# Patient Record
Sex: Male | Born: 1965 | Hispanic: No | Marital: Married | Smoking: Former smoker
Health system: Southern US, Community
[De-identification: ages and names within clinical notes are randomized; demographics above are authoritative.]

## PROBLEM LIST (undated history)

## (undated) DIAGNOSIS — J45909 Unspecified asthma, uncomplicated: Secondary | ICD-10-CM

## (undated) DIAGNOSIS — Z87442 Personal history of urinary calculi: Secondary | ICD-10-CM

## (undated) DIAGNOSIS — T7840XA Allergy, unspecified, initial encounter: Secondary | ICD-10-CM

## (undated) HISTORY — DX: Allergy, unspecified, initial encounter: T78.40XA

## (undated) HISTORY — DX: Unspecified asthma, uncomplicated: J45.909

## (undated) HISTORY — DX: Personal history of urinary calculi: Z87.442

## (undated) HISTORY — PX: LITHOTRIPSY: SUR834

---

## 2005-10-22 ENCOUNTER — Encounter: Admission: RE | Admit: 2005-10-22 | Discharge: 2005-10-22 | Payer: Self-pay | Admitting: Occupational Medicine

## 2005-10-30 ENCOUNTER — Ambulatory Visit (HOSPITAL_COMMUNITY): Admission: RE | Admit: 2005-10-30 | Discharge: 2005-10-30 | Payer: Self-pay | Admitting: Urology

## 2007-03-16 IMAGING — CR DG ABDOMEN 1V
1 series · 1 of 1 positions shown · non-contrast
Comparison: CT dated 10/22/05.

CLINICAL DATA: Nephrolithiasis.  
 ABDOMEN - LXUC2:

[view not recorded]
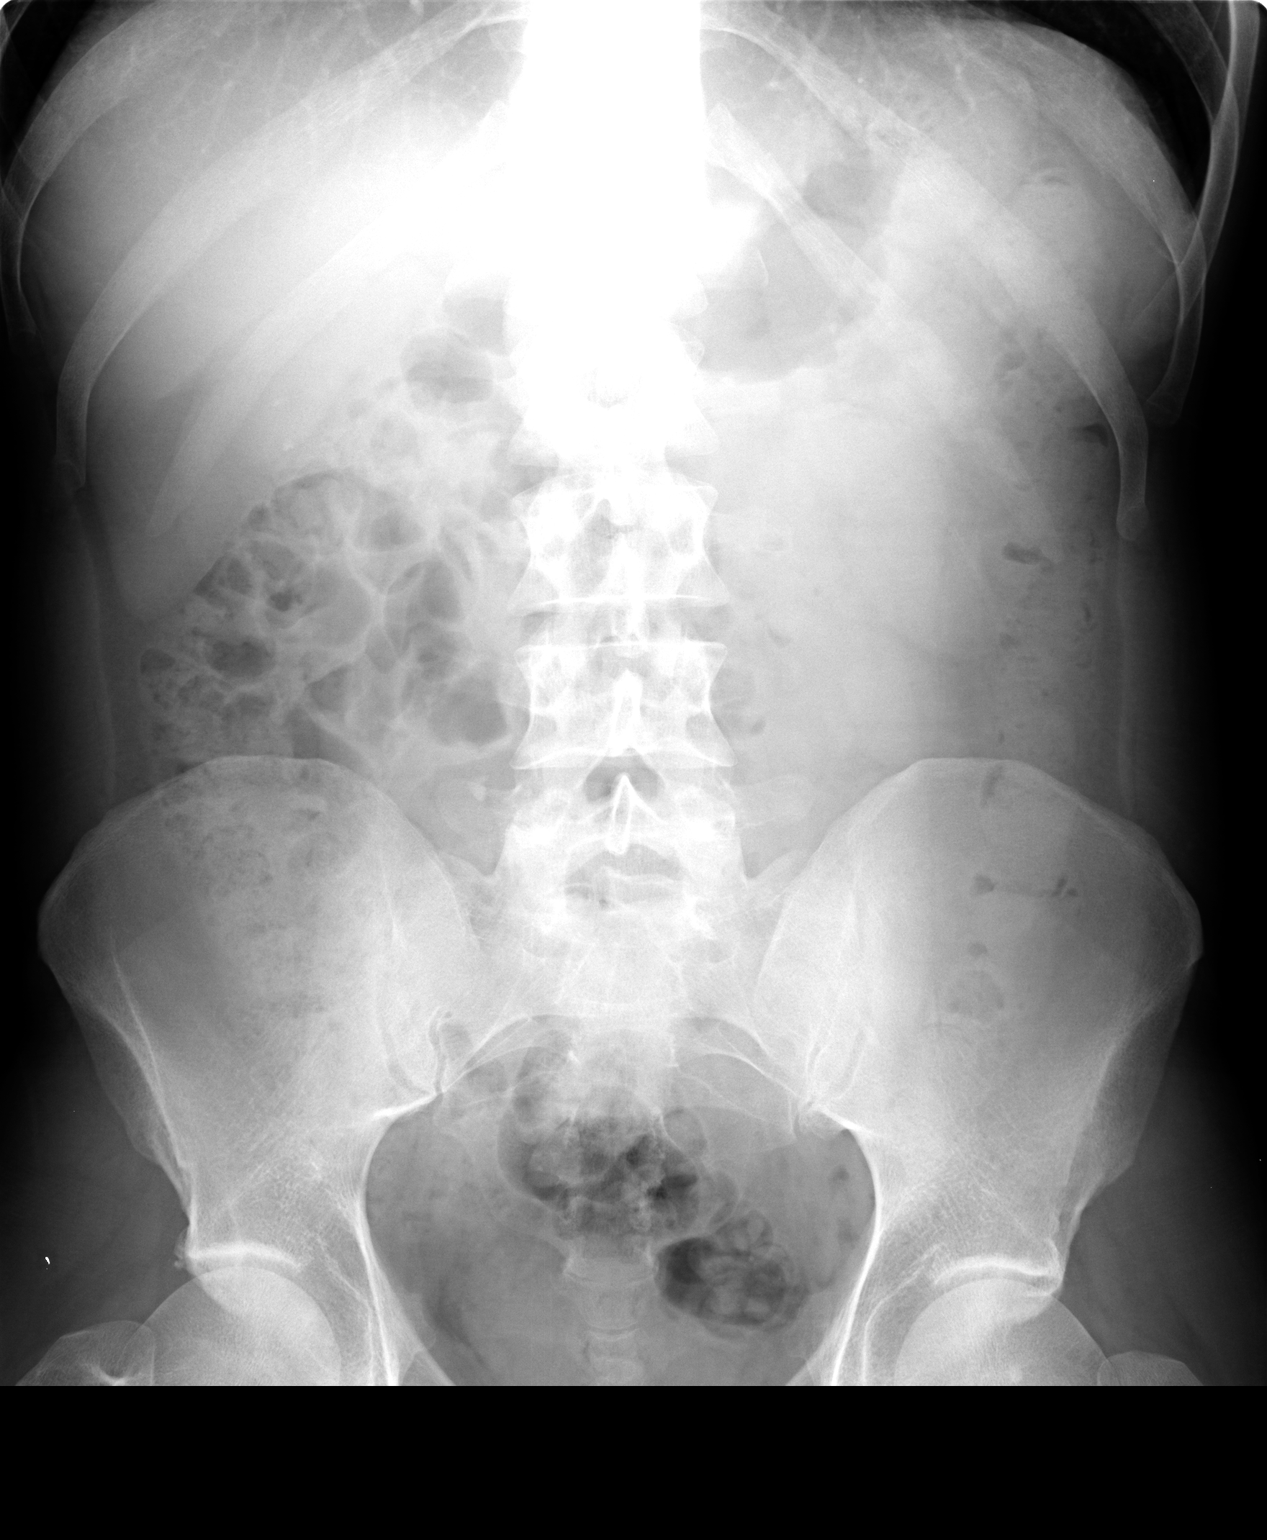

[1 of 1 positions shown; findings below may reference images not displayed]

FINDINGS: Several punctate calcific densities are seen within the right renal collecting system.     Within the mid right ureter, the previously described stone has migrated distally by approximately 2 cm. The ureteral stone is now at the level of the right L5 transverse process whereas it was at the level of the L4 transverse process.
IMPRESSION:

## 2007-07-12 ENCOUNTER — Emergency Department (HOSPITAL_COMMUNITY): Admission: EM | Admit: 2007-07-12 | Discharge: 2007-07-13 | Payer: Self-pay | Admitting: Emergency Medicine

## 2009-09-01 ENCOUNTER — Emergency Department (HOSPITAL_COMMUNITY): Admission: EM | Admit: 2009-09-01 | Discharge: 2009-09-01 | Payer: Self-pay | Admitting: Emergency Medicine

## 2011-01-16 IMAGING — CR DG FINGER THUMB 2+V*L*
3 series · 3 of 3 positions shown · non-contrast
Comparison: None

CLINICAL DATA: Status post fall, with left thumb pain.

LEFT THUMB 2+V

[x finger pa left]
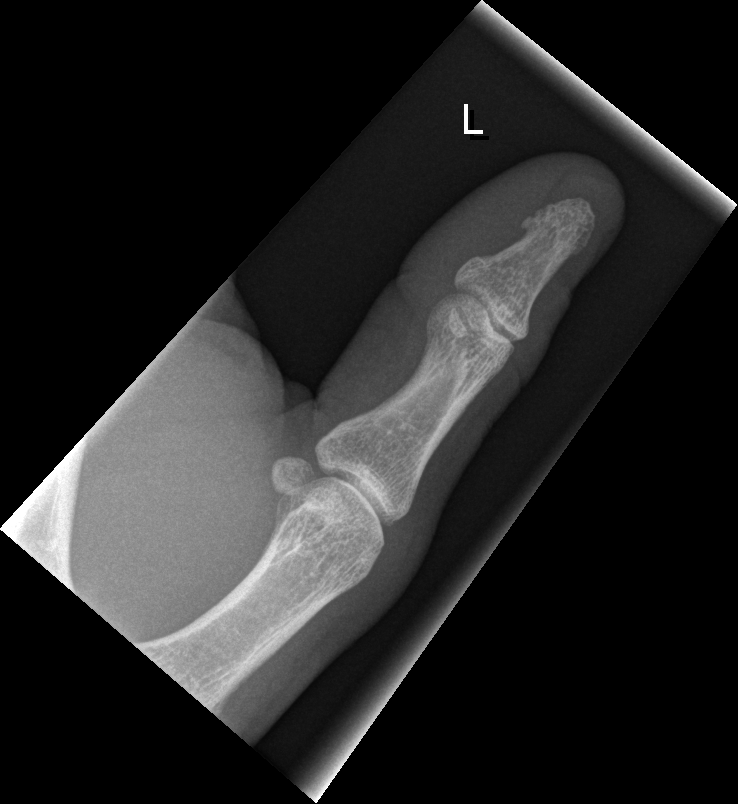

[x finger obl. left]
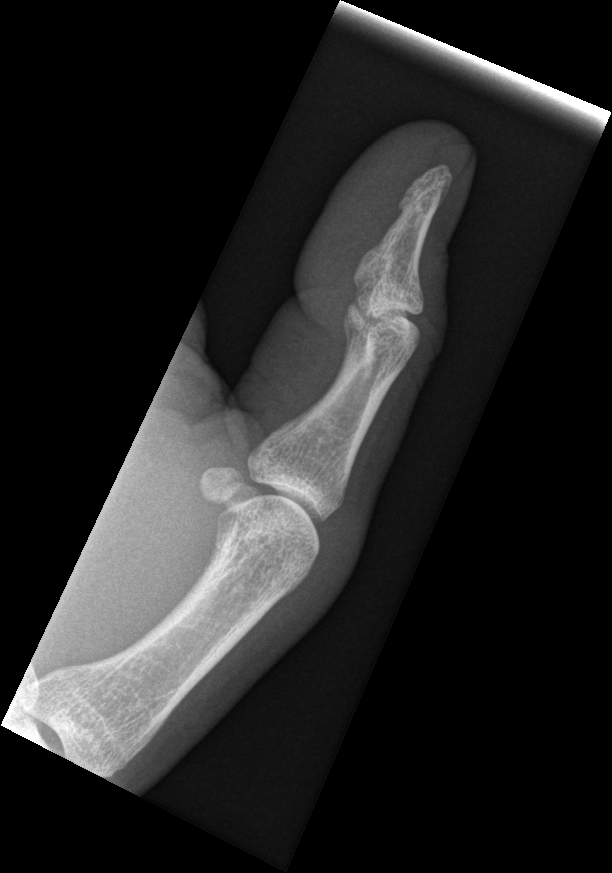

[x finger lateral left]
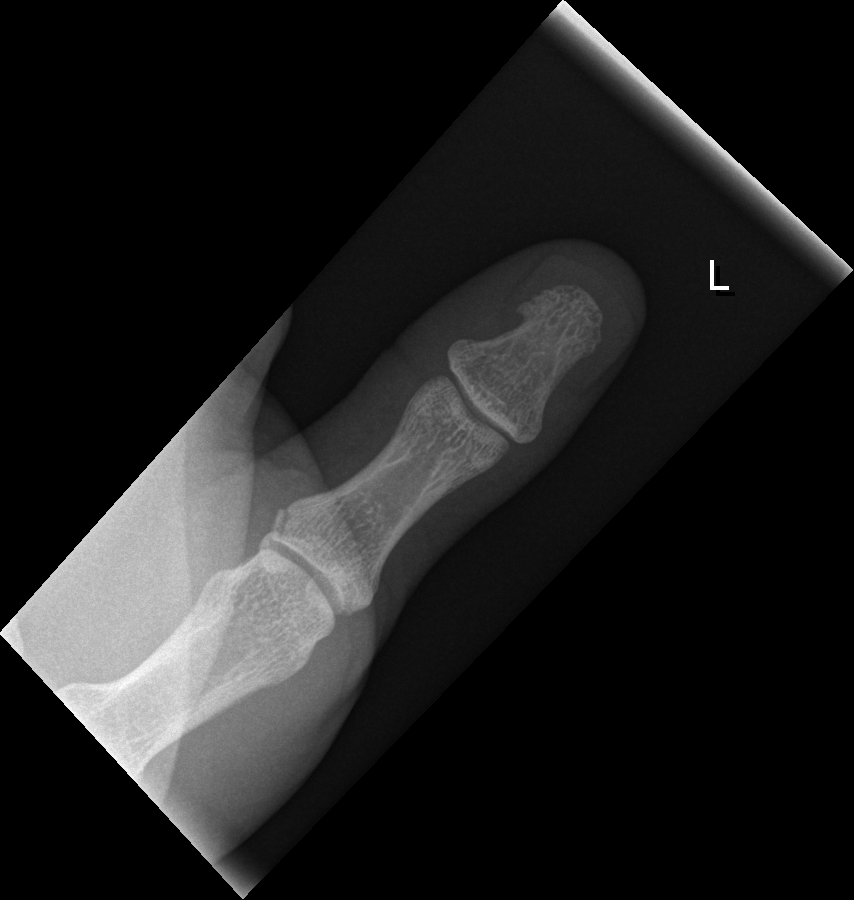

[3 of 3 positions shown; findings below may reference images not displayed]

FINDINGS: There is a small avulsion fracture involving the ulnar
aspect of the base of the first proximal phalanx, with mild
displacement, extending to the first metacarpophalangeal joint.

No additional fractures are seen.  The joint spaces are preserved.
Mild overlying soft tissue swelling is suggested.
IMPRESSION: Avulsion fracture involving the ulnar aspect of the base of the
first proximal phalanx, with mild displacement and intra-articular
extension.

## 2020-04-23 ENCOUNTER — Encounter: Payer: Self-pay | Admitting: Family Medicine

## 2020-04-23 ENCOUNTER — Ambulatory Visit (INDEPENDENT_AMBULATORY_CARE_PROVIDER_SITE_OTHER): Payer: No Typology Code available for payment source | Admitting: Family Medicine

## 2020-04-23 ENCOUNTER — Other Ambulatory Visit: Payer: Self-pay

## 2020-04-23 VITALS — BP 124/80 | HR 80 | Temp 97.9°F | Ht 70.0 in | Wt 191.2 lb

## 2020-04-23 DIAGNOSIS — R6882 Decreased libido: Secondary | ICD-10-CM | POA: Diagnosis not present

## 2020-04-23 DIAGNOSIS — N5201 Erectile dysfunction due to arterial insufficiency: Secondary | ICD-10-CM | POA: Diagnosis not present

## 2020-04-23 DIAGNOSIS — Z87891 Personal history of nicotine dependence: Secondary | ICD-10-CM

## 2020-04-23 DIAGNOSIS — R5383 Other fatigue: Secondary | ICD-10-CM

## 2020-04-23 DIAGNOSIS — Z0000 Encounter for general adult medical examination without abnormal findings: Secondary | ICD-10-CM

## 2020-04-23 LAB — LIPID PANEL
Cholesterol: 177 mg/dL (ref 0–200)
HDL: 43.7 mg/dL (ref >39.00)
LDL Cholesterol: 117 mg/dL — ABNORMAL HIGH (ref 0–99)
NonHDL: 133.68
Total CHOL/HDL Ratio: 4
Triglycerides: 81 mg/dL (ref 0.0–149.0)
VLDL: 16.2 mg/dL (ref 0.0–40.0)

## 2020-04-23 LAB — URINALYSIS, ROUTINE W REFLEX MICROSCOPIC
Bilirubin Urine: NEGATIVE
Hgb urine dipstick: NEGATIVE
Ketones, ur: NEGATIVE
Leukocytes,Ua: NEGATIVE
Nitrite: NEGATIVE
RBC / HPF: NONE SEEN (ref 0–?)
Specific Gravity, Urine: >=1.03 — AB (ref 1.000–1.030)
Total Protein, Urine: NEGATIVE
Urine Glucose: NEGATIVE
Urobilinogen, UA: 0.2 (ref 0.0–1.0)
pH: 5.5 (ref 5.0–8.0)

## 2020-04-23 LAB — COMPREHENSIVE METABOLIC PANEL
ALT: 23 U/L (ref 0–53)
AST: 14 U/L (ref 0–37)
Albumin: 4.2 g/dL (ref 3.5–5.2)
Alkaline Phosphatase: 91 U/L (ref 39–117)
BUN: 18 mg/dL (ref 6–23)
CO2: 30 mEq/L (ref 19–32)
Calcium: 9 mg/dL (ref 8.4–10.5)
Chloride: 106 mEq/L (ref 96–112)
Creatinine, Ser: 1.07 mg/dL (ref 0.40–1.50)
GFR: 72.62 mL/min (ref >60.00)
Glucose, Bld: 99 mg/dL (ref 70–99)
Potassium: 4.3 mEq/L (ref 3.5–5.1)
Sodium: 140 mEq/L (ref 135–145)
Total Bilirubin: 0.6 mg/dL (ref 0.2–1.2)
Total Protein: 6.7 g/dL (ref 6.0–8.3)

## 2020-04-23 LAB — CBC
HCT: 47.1 % (ref 39.0–52.0)
Hemoglobin: 15.6 g/dL (ref 13.0–17.0)
MCHC: 33.2 g/dL (ref 30.0–36.0)
MCV: 91.1 fl (ref 78.0–100.0)
Platelets: 208 10*3/uL (ref 150.0–400.0)
RBC: 5.17 Mil/uL (ref 4.22–5.81)
RDW: 13.9 % (ref 11.5–15.5)
WBC: 7 10*3/uL (ref 4.0–10.5)

## 2020-04-23 LAB — LDL CHOLESTEROL, DIRECT: Direct LDL: 113 mg/dL

## 2020-04-23 LAB — PSA: PSA: 0.71 ng/mL (ref 0.10–4.00)

## 2020-04-23 LAB — HEMOGLOBIN A1C: Hgb A1c MFr Bld: 6.2 % (ref 4.6–6.5)

## 2020-04-23 LAB — TESTOSTERONE: Testosterone: 275.09 ng/dL — ABNORMAL LOW (ref 300.00–890.00)

## 2020-04-23 MED ORDER — TADALAFIL 10 MG PO TABS: 10.0000 mg | ORAL_TABLET | Freq: Every day | ORAL | 4 refills | Status: AC | PRN

## 2020-04-23 NOTE — Progress Notes (Signed)
New Patient Office Visit  Subjective:  Patient ID: Sean Macdonald, male    DOB: 1966/03/23  Age: 54 y.o. MRN: [id number]  CC:  Chief Complaint  Patient presents with  . Establish Care    New patient, CPE no concerns     HPI IZIK BINGMAN presents for a physical exam and follow-up of some problems that he has been experiencing.  He is fasting today.  He is a single father of 2 sons ages 48 and 66.  He is an Theatre stage manager and travels often.  Quit smoking 5 years ago.  He had smoked for 30 years.  Does not use illicit drugs.  He rarely drinks alcohol.  His family is back in [place].  He does not know of any family members lost to Covid.  He has had his Covid vaccine.  Reports a loss of libido along with erectile dysfunction.  This is led to a loss of general interest in things.  He has been stressed and not sleeping well.  Denies frank depression.  He has been fatigued.  Denies weight loss or night sweats.  Father passed from pulmonary fibrosis.  He has never had a colonoscopy.  He has had issues with nocturia in the past but has been fluid recent restricting before bedtime and this is helped.  Urine flow has been okay.  History reviewed. No pertinent past medical history.  History reviewed. No pertinent surgical history.  History reviewed. No pertinent family history.  Social History   Socioeconomic History  . Marital status: Married    Spouse name: Not on file  . Number of children: Not on file  . Years of education: Not on file  . Highest education level: Not on file  Occupational History  . Not on file  Tobacco Use  . Smoking status: Never Smoker  . Smokeless tobacco: Never Used  Vaping Use  . Vaping Use: Never used  Substance and Sexual Activity  . Alcohol use: Yes    Alcohol/week: 2.0 standard drinks    Types: 1 Shots of liquor, 1 Cans of beer per week  . Drug use: Never  . Sexual activity: Yes  Other Topics Concern  . Not on file  Social History Narrative  . Not  on file   Social Determinants of Health   Financial Resource Strain:   . Difficulty of Paying Living Expenses:   Food Insecurity:   . Worried About Charity fundraiser in the Last Year:   . Arboriculturist in the Last Year:   Transportation Needs:   . Film/video editor (Medical):   Marland Kitchen Lack of Transportation (Non-Medical):   Physical Activity:   . Days of Exercise per Week:   . Minutes of Exercise per Session:   Stress:   . Feeling of Stress :   Social Connections:   . Frequency of Communication with Friends and Family:   . Frequency of Social Gatherings with Friends and Family:   . Attends Religious Services:   . Active Member of Clubs or Organizations:   . Attends Archivist Meetings:   Marland Kitchen Marital Status:   Intimate Partner Violence:   . Fear of Current or Ex-Partner:   . Emotionally Abused:   Marland Kitchen Physically Abused:   . Sexually Abused:     ROS Review of Systems  Constitutional: Positive for fatigue. Negative for appetite change, chills, diaphoresis, fever and unexpected weight change.  HENT: Negative.   Eyes: Negative for  photophobia and visual disturbance.  Respiratory: Negative.  Negative for cough and shortness of breath.   Cardiovascular: Negative.   Gastrointestinal: Negative.  Negative for anal bleeding and blood in stool.  Endocrine: Negative for polyphagia and polyuria.  Genitourinary: Negative for difficulty urinating, hematuria and urgency.  Musculoskeletal: Negative for gait problem and joint swelling.  Skin: Negative for pallor and rash.  Allergic/Immunologic: Negative for immunocompromised state.  Neurological: Negative for weakness and light-headedness.  Hematological: Does not bruise/bleed easily.   Depression screen [facility] 2/9 04/23/2020  Decreased Interest 0  Down, Depressed, Hopeless 0  PHQ - 2 Score 0    Objective:   Today's Vitals: BP 124/80   Pulse 80   Temp 97.9 F (36.6 C) (Tympanic)   Ht 5\' 10"  (1.778 m)   Wt 191 lb 3.2 oz  (86.7 kg)   SpO2 97%   BMI 27.43 kg/m   Physical Exam Vitals and nursing note reviewed.  Constitutional:      General: He is not in acute distress.    Appearance: Normal appearance. He is normal weight. He is not ill-appearing, toxic-appearing or diaphoretic.  HENT:     Head: Normocephalic and atraumatic.     Right Ear: Tympanic membrane, ear canal and external ear normal. There is no impacted cerumen.     Left Ear: Tympanic membrane, ear canal and external ear normal. There is no impacted cerumen.     Nose: No congestion or rhinorrhea.     Mouth/Throat:     Mouth: Mucous membranes are moist.     Pharynx: Oropharynx is clear. No oropharyngeal exudate or posterior oropharyngeal erythema.  Eyes:     General: No scleral icterus.       Right eye: No discharge.        Left eye: No discharge.     Extraocular Movements: Extraocular movements intact.     Conjunctiva/sclera: Conjunctivae normal.     Pupils: Pupils are equal, round, and reactive to light.  Cardiovascular:     Rate and Rhythm: Normal rate and regular rhythm.  Pulmonary:     Effort: Pulmonary effort is normal.     Breath sounds: Normal breath sounds.  Abdominal:     General: Abdomen is flat. Bowel sounds are normal. There is no distension.     Palpations: Abdomen is soft. There is no mass.     Tenderness: There is no abdominal tenderness. There is no guarding or rebound.     Hernia: No hernia is present. There is no hernia in the left inguinal area or right inguinal area.  Genitourinary:    Penis: Normal and uncircumcised. No phimosis, paraphimosis, hypospadias, erythema, tenderness, discharge, swelling or lesions.      Testes:        Right: Mass, tenderness or swelling not present. Right testis is descended.        Left: Mass, tenderness or swelling not present. Left testis is descended.     Epididymis:     Right: Not inflamed or enlarged.     Left: Not inflamed or enlarged.     Prostate: Enlarged. Not tender and no  nodules present.     Rectum: Guaiac result negative. No mass, tenderness, anal fissure, external hemorrhoid or internal hemorrhoid. Normal anal tone.  Musculoskeletal:     Cervical back: Normal range of motion. No rigidity or tenderness.     Right lower leg: No edema.     Left lower leg: No edema.  Lymphadenopathy:     Cervical:  No cervical adenopathy.     Lower Body: No right inguinal adenopathy. No left inguinal adenopathy.  Skin:    General: Skin is warm and dry.  Neurological:     Mental Status: He is alert and oriented to person, place, and time.  Psychiatric:        Mood and Affect: Mood normal.        Behavior: Behavior normal.     Assessment & Plan:   Problem List Items Addressed This Visit    None    Visit Diagnoses    Healthcare maintenance    -  Primary   Relevant Orders   CBC   Comprehensive metabolic panel   LDL cholesterol, direct   Hepatitis C antibody   Hemoglobin A1c   Lipid panel   HIV Antibody (routine testing w rflx)   PSA   Urinalysis, Routine w reflex microscopic   Ambulatory referral to Gastroenterology   Erectile dysfunction due to arterial insufficiency       Relevant Medications   tadalafil (CIALIS) 10 MG tablet   History of tobacco use       Relevant Orders   Ambulatory Referral for Lung Cancer Scre   Libido, decreased       Relevant Orders   Testosterone   Fatigue, unspecified type       Relevant Orders   CBC   Comprehensive metabolic panel   Hemoglobin A1c   HIV Antibody (routine testing w rflx)      Outpatient Encounter Medications as of 04/23/2020  Medication Sig  . tadalafil (CIALIS) 10 MG tablet Take 1 tablet (10 mg total) by mouth daily as needed for erectile dysfunction.   No facility-administered encounter medications on file as of 04/23/2020.    Follow-up: Return in about 3 months (around 07/24/2020).   Dysthymia?  Given information on health maintenance disease prevention as well as exercising to stay healthy.  Hoping  that improvement with ED will improve his outlook.  Libby Maw, MD

## 2020-04-23 NOTE — Patient Instructions (Addendum)
Health Maintenance, Male Adopting a healthy lifestyle and getting preventive care are important in promoting health and wellness. Ask your health care provider about:  The right schedule for you to have regular tests and exams.  Things you can do on your own to prevent diseases and keep yourself healthy. What should I know about diet, weight, and exercise? Eat a healthy diet   Eat a diet that includes plenty of vegetables, fruits, low-fat dairy products, and lean protein.  Do not eat a lot of foods that are high in solid fats, added sugars, or sodium. Maintain a healthy weight Body mass index (BMI) is a measurement that can be used to identify possible weight problems. It estimates body fat based on height and weight. Your health care provider can help determine your BMI and help you achieve or maintain a healthy weight. Get regular exercise Get regular exercise. This is one of the most important things you can do for your health. Most adults should:  Exercise for at least 150 minutes each week. The exercise should increase your heart rate and make you sweat (moderate-intensity exercise).  Do strengthening exercises at least twice a week. This is in addition to the moderate-intensity exercise.  Spend less time sitting. Even light physical activity can be beneficial. Watch cholesterol and blood lipids Have your blood tested for lipids and cholesterol at 52 years of age, then have this test every 5 years. You may need to have your cholesterol levels checked more often if:  Your lipid or cholesterol levels are high.  You are older than 52 years of age.  You are at high risk for heart disease. What should I know about cancer screening? Many types of cancers can be detected early and may often be prevented. Depending on your health history and family history, you may need to have cancer screening at various ages. This may include screening for:  Colorectal cancer.  Prostate  cancer.  Skin cancer.  Lung cancer. What should I know about heart disease, diabetes, and high blood pressure? Blood pressure and heart disease  High blood pressure causes heart disease and increases the risk of stroke. This is more likely to develop in people who have high blood pressure readings, are of African descent, or are overweight.  Talk with your health care provider about your target blood pressure readings.  Have your blood pressure checked: ? Every 3-5 years if you are 52-39 years of age. ? Every year if you are 40 years old or older.  If you are between the ages of 65 and 75 and are a current or former smoker, ask your health care provider if you should have a one-time screening for abdominal aortic aneurysm (AAA). Diabetes Have regular diabetes screenings. This checks your fasting blood sugar level. Have the screening done:  Once every three years after age 45 if you are at a normal weight and have a low risk for diabetes.  More often and at a younger age if you are overweight or have a high risk for diabetes. What should I know about preventing infection? Hepatitis B If you have a higher risk for hepatitis B, you should be screened for this virus. Talk with your health care provider to find out if you are at risk for hepatitis B infection. Hepatitis C Blood testing is recommended for:  Everyone born from 1945 through 1965.  Anyone with known risk factors for hepatitis C. Sexually transmitted infections (STIs)  You should be screened each year   for STIs, including gonorrhea and chlamydia, if: ? You are sexually active and are younger than 52 years of age. ? You are older than 52 years of age and your health care provider tells you that you are at risk for this type of infection. ? Your sexual activity has changed since you were last screened, and you are at increased risk for chlamydia or gonorrhea. Ask your health care provider if you are at risk.  Ask your  health care provider about whether you are at high risk for HIV. Your health care provider may recommend a prescription medicine to help prevent HIV infection. If you choose to take medicine to prevent HIV, you should first get tested for HIV. You should then be tested every 3 months for as long as you are taking the medicine. Follow these instructions at home: Lifestyle  Do not use any products that contain nicotine or tobacco, such as cigarettes, e-cigarettes, and chewing tobacco. If you need help quitting, ask your health care provider.  Do not use street drugs.  Do not share needles.  Ask your health care provider for help if you need support or information about quitting drugs. Alcohol use  Do not drink alcohol if your health care provider tells you not to drink.  If you drink alcohol: ? Limit how much you have to 0-2 drinks a day. ? Be aware of how much alcohol is in your drink. In the [place] one drink equals one 12 oz bottle of beer (355 mL), one 5 oz glass of wine (148 mL), or one 1 oz glass of hard liquor (44 mL). General instructions  Schedule regular health, dental, and eye exams.  Stay current with your vaccines.  Tell your health care provider if: ? You often feel depressed. ? You have ever been abused or do not feel safe at home. Summary  Adopting a healthy lifestyle and getting preventive care are important in promoting health and wellness.  Follow your health care provider's instructions about healthy diet, exercising, and getting tested or screened for diseases.  Follow your health care provider's instructions on monitoring your cholesterol and blood pressure. This information is not intended to replace advice given to you by your health care provider. Make sure you discuss any questions you have with your health care provider. Document Revised: 09/15/2018 Document Reviewed: 09/15/2018 Elsevier Patient Education  2020 [employer] Inc[employer]  Preventive Care 40-64 Years  Old, Male Preventive care refers to lifestyle choices and visits with your health care provider that can promote health and wellness. This includes:  A yearly physical exam. This is also called an annual well check.  Regular dental and eye exams.  Immunizations.  Screening for certain conditions.  Healthy lifestyle choices, such as eating a healthy diet, getting regular exercise, not using drugs or products that contain nicotine and tobacco, and limiting alcohol use. What can I expect for my preventive care visit? Physical exam Your health care provider will check:  Height and weight. These may be used to calculate body mass index (BMI), which is a measurement that tells if you are at a healthy weight.  Heart rate and blood pressure.  Your skin for abnormal spots. Counseling Your health care provider may ask you questions about:  Alcohol, tobacco, and drug use.  Emotional well-being.  Home and relationship well-being.  Sexual activity.  Eating habits.  Work and work environment. What immunizations do I need?  Influenza (flu) vaccine  This is recommended every year. Tetanus, diphtheria,   and pertussis (Tdap) vaccine  You may need a Td booster every 10 years. Varicella (chickenpox) vaccine  You may need this vaccine if you have not already been vaccinated. Zoster (shingles) vaccine  You may need this after age 63. Measles, mumps, and rubella (MMR) vaccine  You may need at least one dose of MMR if you were born in 1957 or later. You may also need a second dose. Pneumococcal conjugate (PCV13) vaccine  You may need this if you have certain conditions and were not previously vaccinated. Pneumococcal polysaccharide (PPSV23) vaccine  You may need one or two doses if you smoke cigarettes or if you have certain conditions. Meningococcal conjugate (MenACWY) vaccine  You may need this if you have certain conditions. Hepatitis A vaccine  You may need this if you have  certain conditions or if you travel or work in places where you may be exposed to hepatitis A. Hepatitis B vaccine  You may need this if you have certain conditions or if you travel or work in places where you may be exposed to hepatitis B. Haemophilus influenzae type b (Hib) vaccine  You may need this if you have certain risk factors. Human papillomavirus (HPV) vaccine  If recommended by your health care provider, you may need three doses over 6 months. You may receive vaccines as individual doses or as more than one vaccine together in one shot (combination vaccines). Talk with your health care provider about the risks and benefits of combination vaccines. What tests do I need? Blood tests  Lipid and cholesterol levels. These may be checked every 5 years, or more frequently if you are over 68 years old.  Hepatitis C test.  Hepatitis B test. Screening  Lung cancer screening. You may have this screening every year starting at age 78 if you have a 30-pack-year history of smoking and currently smoke or have quit within the past 15 years.  Prostate cancer screening. Recommendations will vary depending on your family history and other risks.  Colorectal cancer screening. All adults should have this screening starting at age 38 and continuing until age 22. Your health care provider may recommend screening at age 73 if you are at increased risk. You will have tests every 1-10 years, depending on your results and the type of screening test.  Diabetes screening. This is done by checking your blood sugar (glucose) after you have not eaten for a while (fasting). You may have this done every 1-3 years.  Sexually transmitted disease (STD) testing. Follow these instructions at home: Eating and drinking  Eat a diet that includes fresh fruits and vegetables, whole grains, lean protein, and low-fat dairy products.  Take vitamin and mineral supplements as recommended by your health care  provider.  Do not drink alcohol if your health care provider tells you not to drink.  If you drink alcohol: ? Limit how much you have to 0-2 drinks a day. ? Be aware of how much alcohol is in your drink. In the [place] one drink equals one 12 oz bottle of beer (355 mL), one 5 oz glass of wine (148 mL), or one 1 oz glass of hard liquor (44 mL). Lifestyle  Take daily care of your teeth and gums.  Stay active. Exercise for at least 30 minutes on 5 or more days each week.  Do not use any products that contain nicotine or tobacco, such as cigarettes, e-cigarettes, and chewing tobacco. If you need help quitting, ask your health care provider.  If  you are sexually active, practice safe sex. Use a condom or other form of protection to prevent STIs (sexually transmitted infections).  Talk with your health care provider about taking a low-dose aspirin every day starting at age 80. What's next?  Go to your health care provider once a year for a well check visit.  Ask your health care provider how often you should have your eyes and teeth checked.  Stay up to date on all vaccines. This information is not intended to replace advice given to you by your health care provider. Make sure you discuss any questions you have with your health care provider. Document Revised: 09/16/2018 Document Reviewed: 09/16/2018 Elsevier Patient Education  2020 [employer].  Exercising to Stay Healthy To become healthy and stay healthy, it is recommended that you do moderate-intensity and vigorous-intensity exercise. You can tell that you are exercising at a moderate intensity if your heart starts beating faster and you start breathing faster but can still hold a conversation. You can tell that you are exercising at a vigorous intensity if you are breathing much harder and faster and cannot hold a conversation while exercising. Exercising regularly is important. It has many health benefits, such as:  Improving  overall fitness, flexibility, and endurance.  Increasing bone density.  Helping with weight control.  Decreasing body fat.  Increasing muscle strength.  Reducing stress and tension.  Improving overall health. How often should I exercise? Choose an activity that you enjoy, and set realistic goals. Your health care provider can help you make an activity plan that works for you. Exercise regularly as told by your health care provider. This may include:  Doing strength training two times a week, such as: ? Lifting weights. ? Using resistance bands. ? Push-ups. ? Sit-ups. ? Yoga.  Doing a certain intensity of exercise for a given amount of time. Choose from these options: ? A total of 150 minutes of moderate-intensity exercise every week. ? A total of 75 minutes of vigorous-intensity exercise every week. ? A mix of moderate-intensity and vigorous-intensity exercise every week. Children, pregnant women, people who have not exercised regularly, people who are overweight, and older adults may need to talk with a health care provider about what activities are safe to do. If you have a medical condition, be sure to talk with your health care provider before you start a new exercise program. What are some exercise ideas? Moderate-intensity exercise ideas include:  Walking 1 mile (1.6 km) in about 15 minutes.  Biking.  Hiking.  Golfing.  Dancing.  Water aerobics. Vigorous-intensity exercise ideas include:  Walking 4.5 miles (7.2 km) or more in about 1 hour.  Jogging or running 5 miles (8 km) in about 1 hour.  Biking 10 miles (16.1 km) or more in about 1 hour.  Lap swimming.  Roller-skating or in-line skating.  Cross-country skiing.  Vigorous competitive sports, such as football, basketball, and soccer.  Jumping rope.  Aerobic dancing. What are some everyday activities that can help me to get exercise?  [place] work, such as: ? Pushing a Conservation officer, nature. ? Raking and  bagging leaves.  Washing your car.  Pushing a stroller.  Shoveling snow.  Gardening.  Washing windows or floors. How can I be more active in my day-to-day activities?  Use stairs instead of an elevator.  Take a walk during your lunch break.  If you drive, park your car farther away from your work or school.  If you take public transportation, get off  one stop early and walk the rest of the way.  Stand up or walk around during all of your indoor phone calls.  Get up, stretch, and walk around every 30 minutes throughout the day.  Enjoy exercise with a friend. Support to continue exercising will help you keep a regular routine of activity. What guidelines can I follow while exercising?  Before you start a new exercise program, talk with your health care provider.  Do not exercise so much that you hurt yourself, feel dizzy, or get very short of breath.  Wear comfortable clothes and wear shoes with good support.  Drink plenty of water while you exercise to prevent dehydration or heat stroke.  Work out until your breathing and your heartbeat get faster. Where to find more information  [place] Department of Health and Human Services: [employer]at  Centers for Disease Control and Prevention (CDC): http://www.wolf.info/ Summary  Exercising regularly is important. It will improve your overall fitness, flexibility, and endurance.  Regular exercise also will improve your overall health. It can help you control your weight, reduce stress, and improve your bone density.  Do not exercise so much that you hurt yourself, feel dizzy, or get very short of breath.  Before you start a new exercise program, talk with your health care provider. This information is not intended to replace advice given to you by your health care provider. Make sure you discuss any questions you have with your health care provider. Document Revised: 09/04/2017 Document Reviewed: 08/13/2017 Elsevier Patient Education   2020 [employer].

## 2020-04-24 LAB — HEPATITIS C ANTIBODY
Hepatitis C Ab: NONREACTIVE
SIGNAL TO CUT-OFF: 0.02 (ref <1.00)

## 2020-04-24 LAB — HIV ANTIBODY (ROUTINE TESTING W REFLEX): HIV 1&2 Ab, 4th Generation: NONREACTIVE

## 2020-04-25 ENCOUNTER — Encounter: Payer: Self-pay | Admitting: Gastroenterology

## 2020-05-08 ENCOUNTER — Encounter: Payer: Self-pay | Admitting: Family Medicine

## 2020-05-08 ENCOUNTER — Telehealth (INDEPENDENT_AMBULATORY_CARE_PROVIDER_SITE_OTHER): Payer: No Typology Code available for payment source | Admitting: Family Medicine

## 2020-05-08 VITALS — Ht 70.0 in | Wt 188.0 lb

## 2020-05-08 DIAGNOSIS — R6882 Decreased libido: Secondary | ICD-10-CM

## 2020-05-08 DIAGNOSIS — E7800 Pure hypercholesterolemia, unspecified: Secondary | ICD-10-CM

## 2020-05-08 DIAGNOSIS — E291 Testicular hypofunction: Secondary | ICD-10-CM

## 2020-05-08 DIAGNOSIS — R5383 Other fatigue: Secondary | ICD-10-CM | POA: Diagnosis not present

## 2020-05-08 MED ORDER — TESTOSTERONE CYPIONATE 200 MG/ML IJ SOLN: 1.0000 mL | INTRAMUSCULAR | 2 refills | Status: DC

## 2020-05-08 NOTE — Patient Instructions (Signed)
Testosterone injection What is this medicine? TESTOSTERONE (tes TOS ter one) is the main male hormone. It supports normal male development such as muscle growth, facial hair, and deep voice. It is used in males to treat low testosterone levels. This medicine may be used for other purposes; ask your health care provider or pharmacist if you have questions. COMMON BRAND NAME(S): Andro-L.A., Aveed, Delatestryl, Depo-Testosterone, Virilon What should I tell my health care provider before I take this medicine? They need to know if you have any of these conditions:  cancer  diabetes  heart disease  kidney disease  liver disease  lung disease  prostate disease  an unusual or allergic reaction to testosterone, other medicines, foods, dyes, or preservatives  pregnant or trying to get pregnant  breast-feeding How should I use this medicine? This medicine is for injection into a muscle. It is usually given by a health care professional in a hospital or clinic setting. Contact your pediatrician regarding the use of this medicine in children. While this medicine may be prescribed for children as young as 54 years of age for selected conditions, precautions do apply. Overdosage: If you think you have taken too much of this medicine contact a poison control center or emergency room at once. NOTE: This medicine is only for you. Do not share this medicine with others. What if I miss a dose? Try not to miss a dose. Your doctor or health care professional will tell you when your next injection is due. Notify the office if you are unable to keep an appointment. What may interact with this medicine?  medicines for diabetes  medicines that treat or prevent blood clots like warfarin  oxyphenbutazone  propranolol  steroid medicines like prednisone or cortisone This list may not describe all possible interactions. Give your health care provider a list of all the medicines, herbs, non-prescription  drugs, or dietary supplements you use. Also tell them if you smoke, drink alcohol, or use illegal drugs. Some items may interact with your medicine. What should I watch for while using this medicine? Visit your doctor or health care professional for regular checks on your progress. They will need to check the level of testosterone in your blood. This medicine is only approved for use in men who have low levels of testosterone related to certain medical conditions. Heart attacks and strokes have been reported with the use of this medicine. Notify your doctor or health care professional and seek emergency treatment if you develop breathing problems; changes in vision; confusion; chest pain or chest tightness; sudden arm pain; severe, sudden headache; trouble speaking or understanding; sudden numbness or weakness of the face, arm or leg; loss of balance or coordination. Talk to your doctor about the risks and benefits of this medicine. This medicine may affect blood sugar levels. If you have diabetes, check with your doctor or health care professional before you change your diet or the dose of your diabetic medicine. Testosterone injections are not commonly used in women. Women should inform their doctor if they wish to become pregnant or think they might be pregnant. There is a potential for serious side effects to an unborn child. Talk to your health care professional or pharmacist for more information. Talk with your doctor or health care professional about your birth control options while taking this medicine. This drug is banned from use in athletes by most athletic organizations. What side effects may I notice from receiving this medicine? Side effects that you should report to  your doctor or health care professional as soon as possible:  allergic reactions like skin rash, itching or hives, swelling of the face, lips, or tongue  breast enlargement  breathing problems  changes in emotions or  moods  deep or hoarse voice  irregular menstrual periods  signs and symptoms of liver injury like dark yellow or brown urine; general ill feeling or flu-like symptoms; light-colored stools; loss of appetite; nausea; right upper belly pain; unusually weak or tired; yellowing of the eyes or skin  stomach pain  swelling of the ankles, feet, hands  too frequent or persistent erections  trouble passing urine or change in the amount of urine Side effects that usually do not require medical attention (report to your doctor or health care professional if they continue or are bothersome):  acne  change in sex drive or performance  facial hair growth  hair loss  headache This list may not describe all possible side effects. Call your doctor for medical advice about side effects. You may report side effects to FDA at 1-800-FDA-1088. Where should I keep my medicine? Keep out of the reach of children. This medicine can be abused. Keep your medicine in a safe place to protect it from theft. Do not share this medicine with anyone. Selling or giving away this medicine is dangerous and against the law. Store at room temperature between 20 and 25 degrees C (68 and 77 degrees F). Do not freeze. Protect from light. Follow the directions for the product you are prescribed. Throw away any unused medicine after the expiration date. NOTE: This sheet is a summary. It may not cover all possible information. If you have questions about this medicine, talk to your doctor, pharmacist, or health care provider.  2020 Elsevier/Gold Standard (2015-10-27 07:33:55)  

## 2020-05-08 NOTE — Progress Notes (Signed)
Established Patient Office Visit  Subjective:  Patient ID: Sean Macdonald, male    DOB: Feb 18, 1966  Age: 54 y.o. MRN: [id number]  CC:  Chief Complaint  Patient presents with  . Follow-up    discuss labs/testosterone     HPI Sean MATSUO presents for follow-up of his lab results obtained at his recent physical.  Of note LDL cholesterol was mildly elevated with a favorable HDL level.  Testosterone was mildly decreased.  He had reported decreased energy levels and a low libido on exam.  He has tried the Cialis and it seems to help.  He is definitely interested in trying exogenous testosterone therapy.  Hopes that will help his energy levels and make it easier for him to get back to the gym and restart of workout program.  No past medical history on file.  No past surgical history on file.  No family history on file.  Social History   Socioeconomic History  . Marital status: Married    Spouse name: Not on file  . Number of children: Not on file  . Years of education: Not on file  . Highest education level: Not on file  Occupational History  . Not on file  Tobacco Use  . Smoking status: Never Smoker  . Smokeless tobacco: Never Used  Vaping Use  . Vaping Use: Never used  Substance and Sexual Activity  . Alcohol use: Yes    Alcohol/week: 2.0 standard drinks    Types: 1 Shots of liquor, 1 Cans of beer per week  . Drug use: Never  . Sexual activity: Yes  Other Topics Concern  . Not on file  Social History Narrative  . Not on file   Social Determinants of Health   Financial Resource Strain:   . Difficulty of Paying Living Expenses:   Food Insecurity:   . Worried About Charity fundraiser in the Last Year:   . Arboriculturist in the Last Year:   Transportation Needs:   . Film/video editor (Medical):   Marland Kitchen Lack of Transportation (Non-Medical):   Physical Activity:   . Days of Exercise per Week:   . Minutes of Exercise per Session:   Stress:   . Feeling of  Stress :   Social Connections:   . Frequency of Communication with Friends and Family:   . Frequency of Social Gatherings with Friends and Family:   . Attends Religious Services:   . Active Member of Clubs or Organizations:   . Attends Archivist Meetings:   Marland Kitchen Marital Status:   Intimate Partner Violence:   . Fear of Current or Ex-Partner:   . Emotionally Abused:   Marland Kitchen Physically Abused:   . Sexually Abused:     Outpatient Medications Prior to Visit  Medication Sig Dispense Refill  . tadalafil (CIALIS) 10 MG tablet Take 1 tablet (10 mg total) by mouth daily as needed for erectile dysfunction. 10 tablet 4   No facility-administered medications prior to visit.    Allergies  Allergen Reactions  . Aspirin Rash    Caused him to pass out, but no rash or wheeze Caused him to pass out, but no rash or wheeze   . Peanut Oil Rash    ROS Review of Systems  Constitutional: Negative.   Respiratory: Negative.   Cardiovascular: Negative.   Gastrointestinal: Negative.   Genitourinary: Negative.   Psychiatric/Behavioral: Negative.       Objective:    Physical Exam Pulmonary:  Effort: Pulmonary effort is normal.  Neurological:     Mental Status: He is alert and oriented to person, place, and time.  Psychiatric:        Mood and Affect: Mood normal.        Behavior: Behavior normal.     Ht 5\' 10"  (1.778 m)   Wt 188 lb (85.3 kg)   BMI 26.98 kg/m  Wt Readings from Last 3 Encounters:  05/08/20 188 lb (85.3 kg)  04/23/20 191 lb 3.2 oz (86.7 kg)     Health Maintenance Due  Topic Date Due  . COVID-19 Vaccine (1) Never done  . COLONOSCOPY  Never done  . INFLUENZA VACCINE  05/06/2020    There are no preventive care reminders to display for this patient.  No results found for: TSH Lab Results  Component Value Date   WBC 7.0 04/23/2020   HGB 15.6 04/23/2020   HCT 47.1 04/23/2020   MCV 91.1 04/23/2020   PLT 208.0 04/23/2020   Lab Results  Component Value  Date   NA 140 04/23/2020   K 4.3 04/23/2020   CO2 30 04/23/2020   GLUCOSE 99 04/23/2020   BUN 18 04/23/2020   CREATININE 1.07 04/23/2020   BILITOT 0.6 04/23/2020   ALKPHOS 91 04/23/2020   AST 14 04/23/2020   ALT 23 04/23/2020   PROT 6.7 04/23/2020   ALBUMIN 4.2 04/23/2020   CALCIUM 9.0 04/23/2020   GFR 72.62 04/23/2020   Lab Results  Component Value Date   CHOL 177 04/23/2020   Lab Results  Component Value Date   HDL 43.70 04/23/2020   Lab Results  Component Value Date   LDLCALC 117 (H) 04/23/2020   Lab Results  Component Value Date   TRIG 81.0 04/23/2020   Lab Results  Component Value Date   CHOLHDL 4 04/23/2020   Lab Results  Component Value Date   HGBA1C 6.2 04/23/2020      Assessment & Plan:   Problem List Items Addressed This Visit      Endocrine   Androgen deficiency - Primary   Relevant Medications   Testosterone Cypionate 200 MG/ML SOLN     Other   Elevated LDL cholesterol level   Fatigue   Libido, decreased     The 10-year ASCVD risk score Mikey Bussing DC Jr., et al., 2013) is: 7.5%   Values used to calculate the score:     Age: 51 years     Sex: Male     Is Non-Hispanic African American: No     Diabetic: No     Tobacco smoker: Yes     Systolic Blood Pressure: 921 mmHg     Is BP treated: No     HDL Cholesterol: 43.7 mg/dL     Total Cholesterol: 177 mg/dL Meds ordered this encounter  Medications  . Testosterone Cypionate 200 MG/ML SOLN    Sig: Inject 1 mL as directed every 14 (fourteen) days.    Dispense:  5 mL    Refill:  2    Follow-up: Return in about 3 months (around 08/08/2020).   Patient will go to the pharmacy and pick up a vial.  He will make a nurse appointment.  Given the option to learn how to do the injections himself.  He will download information given on testosterone in the wrap-up.  Discussed side effects including elevation of his hemoglobin, mood changes. Libby Maw, MD   Virtual Visit via Telephone  Note  I connected with Sean Mates  Macdonald on 05/08/20 at 11:30 AM EDT by telephone and verified that I am speaking with the correct person using two identifiers.  Location: Patient: alone at home.   Provider:    I discussed the limitations, risks, security and privacy concerns of performing an evaluation and management service by telephone and the availability of in person appointments. I also discussed with the patient that there may be a patient responsible charge related to this service. The patient expressed understanding and agreed to proceed.   History of Present Illness:    Observations/Objective:   Assessment and Plan:   Follow Up Instructions:    I discussed the assessment and treatment plan with the patient. The patient was provided an opportunity to ask questions and all were answered. The patient agreed with the plan and demonstrated an understanding of the instructions.   The patient was advised to call back or seek an in-person evaluation if the symptoms worsen or if the condition fails to improve as anticipated.  I provided 20 minutes of non-face-to-face time during this encounter.   Libby Maw, MD

## 2020-05-10 ENCOUNTER — Telehealth: Payer: Self-pay | Admitting: Family Medicine

## 2020-05-10 ENCOUNTER — Ambulatory Visit (INDEPENDENT_AMBULATORY_CARE_PROVIDER_SITE_OTHER): Payer: No Typology Code available for payment source

## 2020-05-10 ENCOUNTER — Other Ambulatory Visit: Payer: Self-pay

## 2020-05-10 DIAGNOSIS — E349 Endocrine disorder, unspecified: Secondary | ICD-10-CM

## 2020-05-10 DIAGNOSIS — E538 Deficiency of other specified B group vitamins: Secondary | ICD-10-CM

## 2020-05-10 DIAGNOSIS — E291 Testicular hypofunction: Secondary | ICD-10-CM

## 2020-05-10 MED ORDER — CYANOCOBALAMIN 1000 MCG/ML IJ SOLN: 1000.0000 ug | Freq: Once | INTRAMUSCULAR | Status: DC

## 2020-05-10 MED ORDER — TESTOSTERONE CYPIONATE 100 MG/ML IM SOLN
100.0000 mg | INTRAMUSCULAR | Status: AC
  Administered 2020-05-10: 100 mg via INTRAMUSCULAR

## 2020-05-10 NOTE — Progress Notes (Addendum)
After obtaining consent, and per orders of Dr Ethelene Hal, injection of testosterone given and instructions on how to give by Lynda Rainwater. Patient instructed to remain in clinic for 20 minutes afterwards, and to report any adverse reaction to me immediately.  Agreed.

## 2020-05-10 NOTE — Telephone Encounter (Signed)
ERROR

## 2020-06-12 ENCOUNTER — Other Ambulatory Visit: Payer: Self-pay | Admitting: *Deleted

## 2020-06-12 DIAGNOSIS — Z87891 Personal history of nicotine dependence: Secondary | ICD-10-CM

## 2020-06-12 NOTE — Progress Notes (Signed)
hest  

## 2020-06-20 ENCOUNTER — Ambulatory Visit (AMBULATORY_SURGERY_CENTER): Payer: Self-pay | Admitting: *Deleted

## 2020-06-20 ENCOUNTER — Other Ambulatory Visit: Payer: Self-pay

## 2020-06-20 VITALS — Ht 70.0 in | Wt 195.0 lb

## 2020-06-20 DIAGNOSIS — Z1211 Encounter for screening for malignant neoplasm of colon: Secondary | ICD-10-CM

## 2020-06-20 MED ORDER — SUTAB 1479-225-188 MG PO TABS: 1.0000 | ORAL_TABLET | ORAL | 0 refills | Status: DC

## 2020-06-20 NOTE — Progress Notes (Signed)
Patient is here in-person for PV. Patient denies any allergies to eggs or soy. Patient denies any problems with anesthesia/sedation. Patient denies any oxygen use at home. Patient denies taking any diet/weight loss medications or blood thinners. Patient is not being treated for MRSA or C-diff. Patient is aware of our care-partner policy and [id number] safety protocol. EMMI education assisgned to the patient for the procedure information given to pt.   COVID-19 vaccine x1 J&J per pt completed in  04/2020, per patient.   Prep Prescription coupon given to the patient.

## 2020-07-02 ENCOUNTER — Ambulatory Visit
Admission: RE | Admit: 2020-07-02 | Discharge: 2020-07-02 | Disposition: A | Discharge status: Home / Self Care | Payer: No Typology Code available for payment source | Source: Ambulatory Visit | Attending: Acute Care | Admitting: Acute Care

## 2020-07-02 ENCOUNTER — Other Ambulatory Visit: Payer: Self-pay

## 2020-07-02 ENCOUNTER — Encounter: Payer: Self-pay | Admitting: Acute Care

## 2020-07-02 ENCOUNTER — Ambulatory Visit (INDEPENDENT_AMBULATORY_CARE_PROVIDER_SITE_OTHER): Payer: No Typology Code available for payment source | Admitting: Acute Care

## 2020-07-02 VITALS — BP 120/80 | HR 88 | Temp 97.1°F | Ht 70.0 in | Wt 195.0 lb

## 2020-07-02 DIAGNOSIS — Z87891 Personal history of nicotine dependence: Secondary | ICD-10-CM

## 2020-07-02 DIAGNOSIS — Z122 Encounter for screening for malignant neoplasm of respiratory organs: Secondary | ICD-10-CM

## 2020-07-02 NOTE — Patient Instructions (Signed)
Thank you for participating in the [facility]. It was our pleasure to meet you today. We will call you with the results of your scan within the next few days. Your scan will be assigned a Lung RADS category score by the physicians reading the scans.  This Lung RADS score determines follow up scanning.  See below for description of categories, and follow up screening recommendations. We will be in touch to schedule your follow up screening annually or based on recommendations of our providers. We will fax a copy of your scan results to your Primary Care Physician, or the physician who referred you to the program, to ensure they have the results. Please call the office if you have any questions or concerns regarding your scanning experience or results.  Our office number is [phone]. Please speak with Denise Phelps, RN. She is our Lung Cancer Screening RN. If she is unavailable when you call, please have the office staff send her a message. She will return your call at her earliest convenience. Remember, if your scan is normal, we will scan you annually as long as you continue to meet the criteria for the program. (Age 55-77, Current smoker or smoker who has quit within the last 15 years). If you are a smoker, remember, quitting is the single most powerful action that you can take to decrease your risk of lung cancer and other pulmonary, breathing related problems. We know quitting is hard, and we are here to help.  Please let us know if there is anything we can do to help you meet your goal of quitting. If you are a former smoker, congratulations. We are proud of you! Remain smoke free! Remember you can refer friends or family members through the number above.  We will screen them to make sure they meet criteria for the program. Thank you for helping us take better care of you by participating in Lung Screening.  Lung RADS Categories:  Lung RADS 1: no nodules  or definitely non-concerning nodules.  Recommendation is for a repeat annual scan in 12 months.  Lung RADS 2:  nodules that are non-concerning in appearance and behavior with a very low likelihood of becoming an active cancer. Recommendation is for a repeat annual scan in 12 months.  Lung RADS 3: nodules that are probably non-concerning , includes nodules with a low likelihood of becoming an active cancer.  Recommendation is for a 6-month repeat screening scan. Often noted after an upper respiratory illness. We will be in touch to make sure you have no questions, and to schedule your 6-month scan.  Lung RADS 4 A: nodules with concerning findings, recommendation is most often for a follow up scan in 3 months or additional testing based on our provider's assessment of the scan. We will be in touch to make sure you have no questions and to schedule the recommended 3 month follow up scan.  Lung RADS 4 B:  indicates findings that are concerning. We will be in touch with you to schedule additional diagnostic testing based on our provider's  assessment of the scan.   

## 2020-07-02 NOTE — Progress Notes (Signed)
Shared Decision Making Visit Lung Cancer Screening Program [phone])   Eligibility:  Age 54 y.o.  Pack Years Smoking History Calculation 30 pack year smoking history (# packs/per year x # years smoked)  Recent History of coughing up blood  no  Unexplained weight loss? no ( >Than 15 pounds within the last 6 months )  Prior History Lung / other cancer no (Diagnosis within the last 5 years already requiring surveillance chest CT Scans).  Smoking Status Former Smoker  Former Smokers: Years since quit: 6 years  Quit Date: 2015  Visit Components:  Discussion included one or more decision making aids. yes  Discussion included risk/benefits of screening. yes  Discussion included potential follow up diagnostic testing for abnormal scans. yes  Discussion included meaning and risk of over diagnosis. yes  Discussion included meaning and risk of False Positives. yes  Discussion included meaning of total radiation exposure. yes  Counseling Included:  Importance of adherence to annual lung cancer LDCT screening. yes  Impact of comorbidities on ability to participate in the program. yes  Ability and willingness to under diagnostic treatment. yes  Smoking Cessation Counseling:  Current Smokers:   Discussed importance of smoking cessation. yes  Information about tobacco cessation classes and interventions provided to patient. yes  Patient provided with "ticket" for LDCT Scan. yes  Symptomatic Patient. no  Counseling  Diagnosis Code: Tobacco Use Z72.0  Asymptomatic Patient yes  Counseling (Intermediate counseling: > three minutes counseling) O2774  Former Smokers:   Discussed the importance of maintaining cigarette abstinence. yes  Diagnosis Code: Personal History of Nicotine Dependence. J28.786  Information about tobacco cessation classes and interventions provided to patient. Yes  Patient provided with "ticket" for LDCT Scan. yes  Written Order for Lung Cancer  Screening with LDCT placed in Epic. Yes (CT Chest Lung Cancer Screening Low Dose W/O CM) [id number] Z12.2-Screening of respiratory organs Z87.891-Personal history of nicotine dependence  I spent 25 minutes of face to face time with Mr. Sean Macdonald discussing the risks and benefits of lung cancer screening. We viewed a power point together that explained in detail the above noted topics. We took the time to pause the power point at intervals to allow for questions to be asked and answered to ensure understanding. We discussed that he had taken the single most powerful action possible to decrease his risk of developing lung cancer when he quit smoking. I counseled him to remain smoke free, and to contact me if he ever had the desire to smoke again so that I can provide resources and tools to help support the effort to remain smoke free. We discussed the time and location of the scan, and that either  Doroteo Glassman RN or I will call with the results within  24-48 hours of receiving them. He has my card and contact information in the event he needs to speak with me, in addition to a copy of the power point we reviewed as a resource. He verbalized understanding of all of the above and had no further questions upon leaving the office.     I explained to the patient that there has been a high incidence of coronary artery disease noted on these exams. I explained that this is a non-gated exam therefore degree or severity cannot be determined. This patient is not on statin therapy. I have asked the patient to follow-up with their PCP regarding any incidental finding of coronary artery disease and management with diet or medication as they feel is clinically  indicated. The patient verbalized understanding of the above and had no further questions.     Sean Spatz, NP 07/02/2020

## 2020-07-04 ENCOUNTER — Other Ambulatory Visit: Payer: Self-pay

## 2020-07-04 ENCOUNTER — Encounter: Payer: Self-pay | Admitting: Gastroenterology

## 2020-07-04 ENCOUNTER — Ambulatory Visit (AMBULATORY_SURGERY_CENTER): Payer: No Typology Code available for payment source | Admitting: Gastroenterology

## 2020-07-04 VITALS — BP 107/68 | HR 71 | Temp 98.0°F | Resp 16 | Ht 70.0 in | Wt 195.0 lb

## 2020-07-04 DIAGNOSIS — K5730 Diverticulosis of large intestine without perforation or abscess without bleeding: Secondary | ICD-10-CM

## 2020-07-04 DIAGNOSIS — D122 Benign neoplasm of ascending colon: Secondary | ICD-10-CM

## 2020-07-04 DIAGNOSIS — D125 Benign neoplasm of sigmoid colon: Secondary | ICD-10-CM

## 2020-07-04 DIAGNOSIS — Z1211 Encounter for screening for malignant neoplasm of colon: Secondary | ICD-10-CM | POA: Diagnosis not present

## 2020-07-04 DIAGNOSIS — D124 Benign neoplasm of descending colon: Secondary | ICD-10-CM

## 2020-07-04 DIAGNOSIS — K640 First degree hemorrhoids: Secondary | ICD-10-CM

## 2020-07-04 MED ORDER — SODIUM CHLORIDE 0.9 % IV SOLN: 500.0000 mL | Freq: Once | INTRAVENOUS | Status: DC

## 2020-07-04 NOTE — Patient Instructions (Signed)
HANDOUTS PROVIDED ON: POLYPS, DIVERTICULOSIS, & HEMORRHOIDS  The polyps removed today have been sent for pathology.  The results can take 1-3 weeks to receive.  When your next colonoscopy should occur will be based on the pathology results.    You may resume your previous diet and medication schedule.  Use an OTC fiber supplement daily.  (ex.  Citrucel, Fibercon, Konsyl, or Metamucil)  Thank you for allowing [place] to care for you today!!!   YOU HAD AN ENDOSCOPIC PROCEDURE TODAY AT [facility]:   Refer to the procedure report that was given to you for any specific questions about what was found during the examination.  If the procedure report does not answer your questions, please call your gastroenterologist to clarify.  If you requested that your care partner not be given the details of your procedure findings, then the procedure report has been included in a sealed envelope for you to review at your convenience later.  YOU SHOULD EXPECT: Some feelings of bloating in the abdomen. Passage of more gas than usual.  Walking can help get rid of the air that was put into your GI tract during the procedure and reduce the bloating. If you had a lower endoscopy (such as a colonoscopy or flexible sigmoidoscopy) you may notice spotting of blood in your stool or on the toilet paper. If you underwent a bowel prep for your procedure, you may not have a normal bowel movement for a few days.  Please Note:  You might notice some irritation and congestion in your nose or some drainage.  This is from the oxygen used during your procedure.  There is no need for concern and it should clear up in a day or so.  SYMPTOMS TO REPORT IMMEDIATELY:   Following lower endoscopy (colonoscopy or flexible sigmoidoscopy):  Excessive amounts of blood in the stool  Significant tenderness or worsening of abdominal pains  Swelling of the abdomen that is new, acute  Fever of 100F or higher  For urgent or emergent  issues, a gastroenterologist can be reached at any hour by calling [phone]. Do not use MyChart messaging for urgent concerns.    DIET:  We do recommend a small meal at first, but then you may proceed to your regular diet.  Drink plenty of fluids but you should avoid alcoholic beverages for 24 hours.  ACTIVITY:  You should plan to take it easy for the rest of today and you should NOT DRIVE or use heavy machinery until tomorrow (because of the sedation medicines used during the test).    FOLLOW UP: Our staff will call the number listed on your records 48-72 hours following your procedure to check on you and address any questions or concerns that you may have regarding the information given to you following your procedure. If we do not reach you, we will leave a message.  We will attempt to reach you two times.  During this call, we will ask if you have developed any symptoms of COVID 19. If you develop any symptoms (ie: fever, flu-like symptoms, shortness of breath, cough etc.) before then, please call [phone].  If you test positive for Covid 19 in the 2 weeks post procedure, please call and report this information to [place].    If any biopsies were taken you will be contacted by phone or by letter within the next 1-3 weeks.  Please call us at [phone] if you have not heard about the biopsies in 3  weeks.    SIGNATURES/CONFIDENTIALITY: You and/or your care partner have signed paperwork which will be entered into your electronic medical record.  These signatures attest to the fact that that the information above on your After Visit Summary has been reviewed and is understood.  Full responsibility of the confidentiality of this discharge information lies with you and/or your care-partner.

## 2020-07-04 NOTE — Progress Notes (Signed)
No change in medical hx since previsit. VS by CW.

## 2020-07-04 NOTE — Progress Notes (Signed)
Report to PACU, RN, vss, BBS= Clear.  

## 2020-07-04 NOTE — Op Note (Signed)
[facility] Patient Name: Sean Macdonald Procedure Date: 07/04/2020 9:04 AM MRN: [id number] Endoscopist: Gerrit Heck , MD Age: 54 Referring MD:  Date of Birth: 1966/01/11 Gender: Male Account #: [id number] Procedure:                Colonoscopy Indications:              Screening for colorectal malignant neoplasm, This                            is the patient's first colonoscopy Medicines:                Monitored Anesthesia Care Procedure:                Pre-Anesthesia Assessment:                           - Prior to the procedure, a History and Physical                            was performed, and patient medications and                            allergies were reviewed. The patient's tolerance of                            previous anesthesia was also reviewed. The risks                            and benefits of the procedure and the sedation                            options and risks were discussed with the patient.                            All questions were answered, and informed consent                            was obtained. Prior Anticoagulants: The patient has                            taken no previous anticoagulant or antiplatelet                            agents. ASA Grade Assessment: II - A patient with                            mild systemic disease. After reviewing the risks                            and benefits, the patient was deemed in                            satisfactory condition to undergo the procedure.  After obtaining informed consent, the colonoscope                            was passed under direct vision. Throughout the                            procedure, the patient's blood pressure, pulse, and                            oxygen saturations were monitored continuously. The                            Colonoscope was introduced through the anus and                            advanced to the the cecum,  identified by                            appendiceal orifice and ileocecal valve. The                            colonoscopy was performed without difficulty. The                            patient tolerated the procedure well. The quality                            of the bowel preparation was good. The ileocecal                            valve, appendiceal orifice, and rectum were                            photographed. Scope In: 9:12:14 AM Scope Out: 9:34:33 AM Scope Withdrawal Time: 0 hours 20 minutes 33 seconds  Total Procedure Duration: 0 hours 22 minutes 19 seconds  Findings:                 The perianal and digital rectal examinations were                            normal.                           Five sessile polyps were found in the sigmoid colon                            (2), descending colon (1), and ascending colon (2).                            The polyps were 3 to 6 mm in size. These polyps                            were removed with a cold snare. Resection and  retrieval were complete. Estimated blood loss was                            minimal.                           Multiple small and large-mouthed diverticula were                            found in the entire colon.                           Non-bleeding internal hemorrhoids were found during                            retroflexion. The hemorrhoids were medium-sized. Estimated Blood Loss:     Estimated blood loss: none. Estimated blood loss                            was minimal. Impression:               - Five 3 to 6 mm polyps in the sigmoid colon, in                            the descending colon and in the ascending colon,                            removed with a cold snare. Resected and retrieved.                           - Diverticulosis in the entire examined colon.                           - Non-bleeding internal hemorrhoids. Recommendation:           - Patient has a  contact number available for                            emergencies. The signs and symptoms of potential                            delayed complications were discussed with the                            patient. Return to normal activities tomorrow.                            Written discharge instructions were provided to the                            patient.                           - Resume previous diet.                           -  Continue present medications.                           - Await pathology results.                           - Repeat colonoscopy for surveillance based on                            pathology results.                           - Return to GI clinic PRN.                           - Use fiber, for example Citrucel, Fibercon, Konsyl                            or Metamucil.                           - Internal hemorrhoids were noted on this study and                            may be amenable to hemorrhoid band ligation. If you                            are interested in further treatment of these                            hemorrhoids with band ligation, please contact my                            clinic to set up an appointment for evaluation and                            treatment. Gerrit Heck, MD 07/04/2020 9:40:14 AM

## 2020-07-04 NOTE — Progress Notes (Signed)
Called to room to assist during endoscopic procedure.  Patient ID and intended procedure confirmed with present staff. Received instructions for my participation in the procedure from the performing physician.  

## 2020-07-05 NOTE — Progress Notes (Signed)
Please call patient and let them  know their  low dose Ct was read as a Lung RADS 1, negative study: no nodules or definitely benign nodules. Radiology recommendation is for a repeat LDCT in 12 months. .Please let them  know we will order and schedule their  annual screening scan for 06/2021 Please let them  know there was notation of CAD on their  scan.  Please remind the patient  that this is a non-gated exam therefore degree or severity of disease  cannot be determined. Please have them  follow up with their PCP regarding potential risk factor modification, dietary therapy or pharmacologic therapy if clinically indicated. Pt.  is not  currently on statin therapy. Please place order for annual  screening scan for  06/2021 and fax results to PCP. Thanks so much.

## 2020-07-06 ENCOUNTER — Telehealth: Payer: Self-pay

## 2020-07-06 ENCOUNTER — Other Ambulatory Visit: Payer: Self-pay | Admitting: *Deleted

## 2020-07-06 DIAGNOSIS — Z87891 Personal history of nicotine dependence: Secondary | ICD-10-CM

## 2020-07-06 NOTE — Telephone Encounter (Signed)
  Follow up Call-  Call back number 07/04/2020  Post procedure Call Back phone  # [phone]  Permission to leave phone message Yes  Some recent data might be hidden     Patient questions:  Do you have a fever, pain , or abdominal swelling? No. Pain Score  0 *  Have you tolerated food without any problems? Yes.    Have you been able to return to your normal activities? Yes.    Do you have any questions about your discharge instructions: Diet   No. Medications  No. Follow up visit  No.  Do you have questions or concerns about your Care? No.  Actions: * If pain score is 4 or above: No action needed, pain <4. 1. Have you developed a fever since your procedure? no  2.   Have you had an respiratory symptoms (SOB or cough) since your procedure? no  3.   Have you tested positive for COVID 19 since your procedure no  4.   Have you had any family members/close contacts diagnosed with the COVID 19 since your procedure?  no   If yes to any of these questions please route to Joylene John, RN and Joella Prince, RN

## 2020-07-16 ENCOUNTER — Encounter: Payer: Self-pay | Admitting: Gastroenterology

## 2020-07-25 ENCOUNTER — Other Ambulatory Visit: Payer: Self-pay

## 2020-07-26 ENCOUNTER — Ambulatory Visit: Payer: No Typology Code available for payment source | Admitting: Family Medicine

## 2020-08-07 ENCOUNTER — Telehealth (INDEPENDENT_AMBULATORY_CARE_PROVIDER_SITE_OTHER): Payer: No Typology Code available for payment source | Admitting: Family Medicine

## 2020-08-07 ENCOUNTER — Encounter: Payer: Self-pay | Admitting: Family Medicine

## 2020-08-07 VITALS — Ht 70.0 in | Wt 186.0 lb

## 2020-08-07 DIAGNOSIS — E291 Testicular hypofunction: Secondary | ICD-10-CM

## 2020-08-07 DIAGNOSIS — M722 Plantar fascial fibromatosis: Secondary | ICD-10-CM

## 2020-08-07 DIAGNOSIS — Z8739 Personal history of other diseases of the musculoskeletal system and connective tissue: Secondary | ICD-10-CM | POA: Insufficient documentation

## 2020-08-07 MED ORDER — DICLOFENAC SODIUM 1 % EX GEL: CUTANEOUS | 1 refills | Status: AC

## 2020-08-07 NOTE — Progress Notes (Signed)
Established Patient Office Visit  Subjective:  Patient ID: Sean Macdonald, male    DOB: Feb 24, 1966  Age: 54 y.o. MRN: [id number]  CC:  Chief Complaint  Patient presents with  . Follow-up    3 month follow up, patient not sure if he should have refills on Testosterone injections or not. Also states that he have had some gout attacks would like something for this.     HPI Sean Macdonald presents for follow-up on his androgen deficiency, erectile dysfunction and is now having of pain in the heel of his foot. Pain is worse after he is rested overnight for except for a long time and goes to get up. He has a history of gout associated with alcohol ingestion that tends to affect his joints. He is having no difficulty with the testosterone injections. He has noticed some increase in energy. Cialis is helping his erectile dysfunction.  Past Medical History:  Diagnosis Date  . Allergy   . Asthma   . History of kidney stones     Past Surgical History:  Procedure Laterality Date  . LITHOTRIPSY      Family History  Problem Relation Age of Onset  . Colon polyps Neg Hx   . Colon cancer Neg Hx   . Esophageal cancer Neg Hx   . Rectal cancer Neg Hx   . Stomach cancer Neg Hx     Social History   Socioeconomic History  . Marital status: Married    Spouse name: Not on file  . Number of children: Not on file  . Years of education: Not on file  . Highest education level: Not on file  Occupational History  . Not on file  Tobacco Use  . Smoking status: Former Research scientist (life sciences)  . Smokeless tobacco: Never Used  Vaping Use  . Vaping Use: Never used  Substance and Sexual Activity  . Alcohol use: Yes    Alcohol/week: 2.0 standard drinks    Types: 1 Cans of beer, 1 Shots of liquor per week  . Drug use: Never  . Sexual activity: Yes  Other Topics Concern  . Not on file  Social History Narrative  . Not on file   Social Determinants of Health   Financial Resource Strain:   . Difficulty of  Paying Living Expenses: Not on file  Food Insecurity:   . Worried About Charity fundraiser in the Last Year: Not on file  . Ran Out of Food in the Last Year: Not on file  Transportation Needs:   . Lack of Transportation (Medical): Not on file  . Lack of Transportation (Non-Medical): Not on file  Physical Activity:   . Days of Exercise per Week: Not on file  . Minutes of Exercise per Session: Not on file  Stress:   . Feeling of Stress : Not on file  Social Connections:   . Frequency of Communication with Friends and Family: Not on file  . Frequency of Social Gatherings with Friends and Family: Not on file  . Attends Religious Services: Not on file  . Active Member of Clubs or Organizations: Not on file  . Attends Archivist Meetings: Not on file  . Marital Status: Not on file  Intimate Partner Violence:   . Fear of Current or Ex-Partner: Not on file  . Emotionally Abused: Not on file  . Physically Abused: Not on file  . Sexually Abused: Not on file    Outpatient Medications Prior to Visit  Medication Sig Dispense Refill  . fluticasone (FLONASE) 50 MCG/ACT nasal spray Place into both nostrils daily.    . tadalafil (CIALIS) 10 MG tablet Take 1 tablet (10 mg total) by mouth daily as needed for erectile dysfunction. 10 tablet 4  . Testosterone Cypionate 200 MG/ML SOLN Inject 1 mL as directed every 14 (fourteen) days. (Patient not taking: Reported on 08/07/2020) 5 mL 2   Facility-Administered Medications Prior to Visit  Medication Dose Route Frequency Provider Last Rate Last Admin  . testosterone cypionate (DEPOTESTOTERONE CYPIONATE) injection 100 mg  100 mg Intramuscular Q14 Days Libby Maw, MD   100 mg at 05/10/20 1554    Allergies  Allergen Reactions  . Aspirin Rash    Caused him to pass out, but no rash or wheeze Caused him to pass out, but no rash or wheeze   . Peanut Oil Rash    ROS Review of Systems  Constitutional: Negative.  Negative for  fatigue.  HENT: Negative.   Eyes: Negative for photophobia and visual disturbance.  Respiratory: Negative.   Cardiovascular: Negative.   Genitourinary: Negative.   Musculoskeletal: Positive for gait problem.  Skin: Negative.   Hematological: Does not bruise/bleed easily.  Psychiatric/Behavioral: Negative.       Objective:    Physical Exam Vitals and nursing note reviewed.  Constitutional:      General: He is not in acute distress.    Appearance: Normal appearance. He is not ill-appearing, toxic-appearing or diaphoretic.  Pulmonary:     Effort: Pulmonary effort is normal.  Neurological:     Mental Status: He is alert and oriented to person, place, and time.  Psychiatric:        Mood and Affect: Mood normal.        Behavior: Behavior normal.     Ht 5\' 10"  (1.778 m)   Wt 186 lb (84.4 kg)   BMI 26.69 kg/m  Wt Readings from Last 3 Encounters:  08/07/20 186 lb (84.4 kg)  07/04/20 195 lb (88.5 kg)  07/02/20 195 lb (88.5 kg)     Health Maintenance Due  Topic Date Due  . COVID-19 Vaccine (1) Never done  . INFLUENZA VACCINE  05/06/2020    There are no preventive care reminders to display for this patient.  No results found for: TSH Lab Results  Component Value Date   WBC 7.0 04/23/2020   HGB 15.6 04/23/2020   HCT 47.1 04/23/2020   MCV 91.1 04/23/2020   PLT 208.0 04/23/2020   Lab Results  Component Value Date   NA 140 04/23/2020   K 4.3 04/23/2020   CO2 30 04/23/2020   GLUCOSE 99 04/23/2020   BUN 18 04/23/2020   CREATININE 1.07 04/23/2020   BILITOT 0.6 04/23/2020   ALKPHOS 91 04/23/2020   AST 14 04/23/2020   ALT 23 04/23/2020   PROT 6.7 04/23/2020   ALBUMIN 4.2 04/23/2020   CALCIUM 9.0 04/23/2020   GFR 72.62 04/23/2020   Lab Results  Component Value Date   CHOL 177 04/23/2020   Lab Results  Component Value Date   HDL 43.70 04/23/2020   Lab Results  Component Value Date   LDLCALC 117 (H) 04/23/2020   Lab Results  Component Value Date   TRIG  81.0 04/23/2020   Lab Results  Component Value Date   CHOLHDL 4 04/23/2020   Lab Results  Component Value Date   HGBA1C 6.2 04/23/2020      Assessment & Plan:   Problem List Items Addressed This  Visit      Endocrine   Androgen deficiency - Primary     Musculoskeletal and Integument   Plantar fasciitis   Relevant Medications   diclofenac Sodium (VOLTAREN) 1 % GEL     Other   History of gout      Meds ordered this encounter  Medications  . diclofenac Sodium (VOLTAREN) 1 % GEL    Sig: Apply a small grape sized amount to sore place on heel 3-4 times daily    Dispense:  150 g    Refill:  1    Follow-up: No follow-ups on file.   Patient will download the information on plantar fasciitis. He will try Voltaren gel. If that does not help in a week he will follow back up in the clinic to see another provider. He did not realize that he has refills on his testosterone and Cialis. He will follow-up with me in 3 months and we will check his uric acid also at that time. Libby Maw, MD   Virtual Visit via Telephone Note  I connected with Mila Palmer on 08/07/20 at 11:30 AM EDT by telephone and verified that I am speaking with the correct person using two identifiers.  Location: Patient: at the gym Provider: home   I discussed the limitations, risks, security and privacy concerns of performing an evaluation and management service by telephone and the availability of in person appointments. I also discussed with the patient that there may be a patient responsible charge related to this service. The patient expressed understanding and agreed to proceed.   History of Present Illness:    Observations/Objective:   Assessment and Plan:   Follow Up Instructions:    I discussed the assessment and treatment plan with the patient. The patient was provided an opportunity to ask questions and all were answered. The patient agreed with the plan and demonstrated an  understanding of the instructions.   The patient was advised to call back or seek an in-person evaluation if the symptoms worsen or if the condition fails to improve as anticipated.  I provided 15 minutes of non-face-to-face time during this encounter.   Libby Maw, MD

## 2020-08-07 NOTE — Patient Instructions (Signed)
Plantar Fasciitis  Plantar fasciitis is a painful foot condition that affects the heel. It occurs when the band of tissue that connects the toes to the heel bone (plantar fascia) becomes irritated. This can happen as the result of exercising too much or doing other repetitive activities (overuse injury). The pain from plantar fasciitis can range from mild irritation to severe pain that makes it difficult to walk or move. The pain is usually worse in the morning after sleeping, or after sitting or lying down for a while. Pain may also be worse after long periods of walking or standing. What are the causes? This condition may be caused by:  Standing for long periods of time.  Wearing shoes that do not have good arch support.  Doing activities that put stress on joints (high-impact activities), including running, aerobics, and ballet.  Being overweight.  An abnormal way of walking (gait).  Tight muscles in the back of your lower leg (calf).  High arches in your feet.  Starting a new athletic activity. What are the signs or symptoms? The main symptom of this condition is heel pain. Pain may:  Be worse with first steps after a time of rest, especially in the morning after sleeping or after you have been sitting or lying down for a while.  Be worse after long periods of standing still.  Decrease after 30-45 minutes of activity, such as gentle walking. How is this diagnosed? This condition may be diagnosed based on your medical history and your symptoms. Your health care provider may ask questions about your activity level. Your health care provider will do a physical exam to check for:  A tender area on the bottom of your foot.  A high arch in your foot.  Pain when you move your foot.  Difficulty moving your foot. You may have imaging tests to confirm the diagnosis, such as:  X-rays.  Ultrasound.  MRI. How is this treated? Treatment for plantar fasciitis depends on how  severe your condition is. Treatment may include:  Rest, ice, applying pressure (compression), and raising the affected foot (elevation). This may be called RICE therapy. Your health care provider may recommend RICE therapy along with over-the-counter pain medicines to manage your pain.  Exercises to stretch your calves and your plantar fascia.  A splint that holds your foot in a stretched, upward position while you sleep (night splint).  Physical therapy to relieve symptoms and prevent problems in the future.  Injections of steroid medicine (cortisone) to relieve pain and inflammation.  Stimulating your plantar fascia with electrical impulses (extracorporeal shock wave therapy). This is usually the last treatment option before surgery.  Surgery, if other treatments have not worked after 12 months. Follow these instructions at home:  Managing pain, stiffness, and swelling  If directed, put ice on the painful area: ? Put ice in a plastic bag, or use a frozen bottle of water. ? Place a towel between your skin and the bag or bottle. ? Roll the bottom of your foot over the bag or bottle. ? Do this for 20 minutes, 2-3 times a day.  Wear athletic shoes that have air-sole or gel-sole cushions, or try wearing soft shoe inserts that are designed for plantar fasciitis.  Raise (elevate) your foot above the level of your heart while you are sitting or lying down. Activity  Avoid activities that cause pain. Ask your health care provider what activities are safe for you.  Do physical therapy exercises and stretches as told   by your health care provider.  Try activities and forms of exercise that are easier on your joints (low-impact). Examples include swimming, water aerobics, and biking. General instructions  Take over-the-counter and prescription medicines only as told by your health care provider.  Wear a night splint while sleeping, if told by your health care provider. Loosen the splint  if your toes tingle, become numb, or turn cold and blue.  Maintain a healthy weight, or work with your health care provider to lose weight as needed.  Keep all follow-up visits as told by your health care provider. This is important. Contact a health care provider if you:  Have symptoms that do not go away after caring for yourself at home.  Have pain that gets worse.  Have pain that affects your ability to move or do your daily activities. Summary  Plantar fasciitis is a painful foot condition that affects the heel. It occurs when the band of tissue that connects the toes to the heel bone (plantar fascia) becomes irritated.  The main symptom of this condition is heel pain that may be worse after exercising too much or standing still for a long time.  Treatment varies, but it usually starts with rest, ice, compression, and elevation (RICE therapy) and over-the-counter medicines to manage pain. This information is not intended to replace advice given to you by your health care provider. Make sure you discuss any questions you have with your health care provider. Document Revised: 09/04/2017 Document Reviewed: 07/20/2017 Elsevier Patient Education  [place].  Plantar Fasciitis Rehab Ask your health care provider which exercises are safe for you. Do exercises exactly as told by your health care provider and adjust them as directed. It is normal to feel mild stretching, pulling, tightness, or discomfort as you do these exercises. Stop right away if you feel sudden pain or your pain gets worse. Do not begin these exercises until told by your health care provider. Stretching and range-of-motion exercises These exercises warm up your muscles and joints and improve the movement and flexibility of your foot. These exercises also help to relieve pain. Plantar fascia stretch  1. Sit with your left / right leg crossed over your opposite knee. 2. Hold your heel with one hand with that  thumb near your arch. With your other hand, hold your toes and gently pull them back toward the top of your foot. You should feel a stretch on the bottom of your toes or your foot (plantar fascia) or both. 3. Hold this stretch for__________ seconds. 4. Slowly release your toes and return to the starting position. Repeat __________ times. Complete this exercise __________ times a day. Gastrocnemius stretch, standing This exercise is also called a calf (gastroc) stretch. It stretches the muscles in the back of the upper calf. 1. Stand with your hands against a wall. 2. Extend your left / right leg behind you, and bend your front knee slightly. 3. Keeping your heels on the floor and your back knee straight, shift your weight toward the wall. Do not arch your back. You should feel a gentle stretch in your upper left / right calf. 4. Hold this position for __________ seconds. Repeat __________ times. Complete this exercise __________ times a day. Soleus stretch, standing This exercise is also called a calf (soleus) stretch. It stretches the muscles in the back of the lower calf. 1. Stand with your hands against a wall. 2. Extend your left / right leg behind you, and bend your front  knee slightly. 3. Keeping your heels on the floor, bend your back knee and shift your weight slightly over your back leg. You should feel a gentle stretch deep in your lower calf. 4. Hold this position for __________ seconds. Repeat __________ times. Complete this exercise __________ times a day. Gastroc and soleus stretch, standing step This exercise stretches the muscles in the back of the lower leg. These muscles are in the upper calf (gastrocnemius) and the lower calf (soleus). 1. Stand with the ball of your left / right foot on a step. The ball of your foot is on the walking surface, right under your toes. 2. Keep your other foot firmly on the same step. 3. Hold on to the wall or a railing for balance. 4. Slowly  lift your other foot, allowing your body weight to press your left / right heel down over the edge of the step. You should feel a stretch in your left / right calf. 5. Hold this position for __________ seconds. 6. Return both feet to the step. 7. Repeat this exercise with a slight bend in your left / right knee. Repeat __________ times with your left / right knee straight and __________ times with your left / right knee bent. Complete this exercise __________ times a day. Balance exercise This exercise builds your balance and strength control of your arch to help take pressure off your plantar fascia. Single leg stand If this exercise is too easy, you can try it with your eyes closed or while standing on a pillow. 1. Without shoes, stand near a railing or in a doorway. You may hold on to the railing or door frame as needed. 2. Stand on your left / right foot. Keep your big toe down on the floor and try to keep your arch lifted. Do not let your foot roll inward. 3. Hold this position for __________ seconds. Repeat __________ times. Complete this exercise __________ times a day. This information is not intended to replace advice given to you by your health care provider. Make sure you discuss any questions you have with your health care provider. Document Revised: 01/13/2019 Document Reviewed: 07/21/2018 Elsevier Patient Education  [place].

## 2020-09-20 ENCOUNTER — Telehealth: Payer: Self-pay | Admitting: Gastroenterology

## 2020-09-20 NOTE — Telephone Encounter (Signed)
Patient requesting appointment for hemorrhoid banding per last procedure note. Patient scheduled for 09/25/20. He has been treating his hemorrhoids with Preparation H with good relief.

## 2020-09-25 ENCOUNTER — Ambulatory Visit (INDEPENDENT_AMBULATORY_CARE_PROVIDER_SITE_OTHER): Payer: No Typology Code available for payment source | Admitting: Gastroenterology

## 2020-09-25 ENCOUNTER — Encounter: Payer: Self-pay | Admitting: Gastroenterology

## 2020-09-25 VITALS — BP 112/70 | HR 81 | Ht 70.0 in | Wt 190.5 lb

## 2020-09-25 DIAGNOSIS — K649 Unspecified hemorrhoids: Secondary | ICD-10-CM | POA: Diagnosis not present

## 2020-09-25 NOTE — Progress Notes (Signed)
P  Chief Complaint:    Symptomatic Internal Hemorrhoids; Hemorrhoid Band Ligation  Endoscopic History: -Colonoscopy (06/2020): 5 polyps (TA x4, HP x1), diverticulosis, internal hemorrhoids.  Repeat in 3 years  HPI:     Patient is a 54 y.o. malewith a history of symptomatic internal hemorrhoids presenting to [facility] for follow-up and ongoing treatment. The patient presents with symptomatic grade 2 hemorrhoids, unresponsive to maximal medical therapy, requesting rubber band ligation of symptomatic hemorrhoidal disease.  No change in medical or surgical history, medications, allergies, social history since last appointment with me.   Review of systems:     No chest pain, no SOB, no fevers, no urinary sx   Past Medical History:  Diagnosis Date  . Allergy   . Asthma   . History of kidney stones     Patient's surgical history, family medical history, social history, medications and allergies were all reviewed in Epic    Current Outpatient Medications  Medication Sig Dispense Refill  . diclofenac Sodium (VOLTAREN) 1 % GEL Apply a small grape sized amount to sore place on heel 3-4 times daily (Patient taking differently: Apply 2 g topically 2 (two) times daily. Apply a small grape sized amount to sore place on heel 3-4 times daily) 150 g 1  . fluticasone (FLONASE) 50 MCG/ACT nasal spray Place into both nostrils daily.    . tadalafil (CIALIS) 10 MG tablet Take 1 tablet (10 mg total) by mouth daily as needed for erectile dysfunction. 10 tablet 4  . Testosterone Cypionate 200 MG/ML SOLN Inject 1 mL as directed every 14 (fourteen) days. 5 mL 2   Current Facility-Administered Medications  Medication Dose Route Frequency Provider Last Rate Last Admin  . testosterone cypionate (DEPOTESTOTERONE CYPIONATE) injection 100 mg  100 mg Intramuscular Q14 Days Libby Maw, MD   100 mg at 05/10/20 1554    Physical Exam:     BP 112/70   Pulse 81   Ht 5\' 10"   (1.778 m)   Wt 190 lb 8 oz (86.4 kg)   BMI 27.33 kg/m   GENERAL:  Pleasant male in NAD PSYCH: : Cooperative, normal affect NEURO: Alert and oriented x 3, no focal neurologic deficits Rectal exam: Sensation intact and preserved anal wink.  Grade 2 hemorrhoids noted in all positions on anoscopy.  No external anal fissures noted. Normal sphincter tone. No palpable mass. No blood on the exam glove. (Chaperone: Lanny Hurst, CMA).   IMPRESSION and PLAN:    #1.  Symptomatic internal hemorrhoids: PROCEDURE NOTE: The patient presents with symptomatic grade 2 hemorrhoids, unresponsive to maximal medical therapy, requesting rubber band ligation of symptomatic hemorrhoidal disease.  All risks, benefits and alternative forms of therapy were described and informed consent was obtained.  In the Left Lateral Decubitus position, anoscopic examination revealed grade 2 hemorrhoids in the all position(s).  The anorectum was pre-medicated with RectiCare. The decision was made to band the RA internal hemorrhoid, and the [place] was used to perform band ligation without complication.  Digital anorectal examination was then performed to assure proper positioning of the band, and to adjust the banded tissue as required.  The patient was discharged home without pain or other issues.  Dietary and behavioral recommendations were given and along with follow-up instructions.     The following adjunctive treatments were recommended:  -Resume high-fiber diet with fiber supplement (i.e. Citrucel or Benefiber) with goal for soft stools without straining to have a BM. -Resume adequate fluid  intake.  The patient will return in 2-4 for  follow-up and possible additional banding as required. No complications were encountered and the patient tolerated the procedure well.      #2.  History of colon polyps -Repeat colonoscopy 2020 for ongoing polyp surveillance      Lavena Bullion ,DO, FACG 09/25/2020,  10:22 AM

## 2020-09-25 NOTE — Patient Instructions (Addendum)
If you are age 52 or older, your body mass index should be between 23-30. Your Body mass index is 27.33 kg/m. If this is out of the aforementioned range listed, please consider follow up with your Primary Care Provider.  If you are age 20 or younger, your body mass index should be between 19-25. Your Body mass index is 27.33 kg/m. If this is out of the aformentioned range listed, please consider follow up with your Primary Care Provider.   HEMORRHOID BANDING PROCEDURE    FOLLOW-UP CARE   1. The procedure you have had should have been relatively painless since the banding of the area involved does not have nerve endings and there is no pain sensation.  The rubber band cuts off the blood supply to the hemorrhoid and the band may fall off as soon as 48 hours after the banding (the band may occasionally be seen in the toilet bowl following a bowel movement). You may notice a temporary feeling of fullness in the rectum which should respond adequately to plain Tylenol or Motrin.  2. Following the banding, avoid strenuous exercise that evening and resume full activity the next day.  A sitz bath (soaking in a warm tub) or bidet is soothing, and can be useful for cleansing the area after bowel movements.     3. To avoid constipation, take two tablespoons of natural wheat bran, natural oat bran, flax, Benefiber or any over the counter fiber supplement and increase your water intake to 7-8 glasses daily.    4. Unless you have been prescribed anorectal medication, do not put anything inside your rectum for two weeks: No suppositories, enemas, fingers, etc.  5. Occasionally, you may have more bleeding than usual after the banding procedure.  This is often from the untreated hemorrhoids rather than the treated one.  Dont be concerned if there is a tablespoon or so of blood.  If there is more blood than this, lie flat with your bottom higher than your head and apply an ice pack to the area. If the bleeding  does not stop within a half an hour or if you feel faint, call our office at [phone] or go to the emergency room.  6. Problems are not common; however, if there is a substantial amount of bleeding, severe pain, chills, fever or difficulty passing urine (very rare) or other problems, you should call us at [phone] or report to the nearest emergency room.  7. Do not stay seated continuously for more than 2-3 hours for a day or two after the procedure.  Tighten your buttock muscles 10-15 times every two hours and take 10-15 deep breaths every 1-2 hours.  Do not spend more than a few minutes on the toilet if you cannot empty your bowel; instead re-visit the toilet at a later time.    Due to recent changes in healthcare laws, you may see the results of your imaging and laboratory studies on MyChart before your provider has had a chance to review them.  We understand that in some cases there may be results that are confusing or concerning to you. Not all laboratory results come back in the same time frame and the provider may be waiting for multiple results in order to interpret others.  Please give [place] 48 hours in order for your provider to thoroughly review all the results before contacting the office for clarification of your results. .  Thank you for choosing me and [facility].  [employer]  Cirigliano, D.O. Wrap

## 2020-10-23 ENCOUNTER — Ambulatory Visit: Payer: No Typology Code available for payment source | Admitting: Gastroenterology

## 2020-11-05 ENCOUNTER — Other Ambulatory Visit: Payer: Self-pay | Admitting: Family Medicine

## 2020-11-05 DIAGNOSIS — E291 Testicular hypofunction: Secondary | ICD-10-CM

## 2021-08-09 ENCOUNTER — Telehealth: Payer: Self-pay | Admitting: Acute Care

## 2021-08-09 NOTE — Telephone Encounter (Signed)
Called patient to schedule Low Dose CT Chest CA Screening. Pt stated that he was not going to have this CT done every year. Pt stated that he had this done last year, but is not willing to have it done every year.  Pt stated maybe he would have this done next year.  Advised patient that phone message would be sent to provider. Rhonda J Cobb

## 2021-08-13 ENCOUNTER — Other Ambulatory Visit: Payer: Self-pay | Admitting: *Deleted

## 2021-08-13 DIAGNOSIS — Z87891 Personal history of nicotine dependence: Secondary | ICD-10-CM

## 2021-08-26 NOTE — Telephone Encounter (Signed)
Noted . Will attempt to call pt 10/2021 to see if he is willing to schedule at that time.

## 2022-01-17 ENCOUNTER — Encounter: Payer: Self-pay | Admitting: Acute Care

## 2023-05-15 ENCOUNTER — Telehealth: Payer: Self-pay | Admitting: Family Medicine

## 2023-05-15 NOTE — Telephone Encounter (Signed)
Patient has not been seen by current PCP in a year. Reached out to the patient to see if they have a new PCP or if they would like to continue care with the provider.  LVM to schedule

## 2024-02-10 ENCOUNTER — Encounter: Payer: Self-pay | Admitting: *Deleted
